# Patient Record
Sex: Female | Born: 1990 | Race: White | Hispanic: No | Marital: Single | Smoking: Former smoker
Health system: Southern US, Community
[De-identification: ages and names within clinical notes are randomized; demographics above are authoritative.]

## PROBLEM LIST (undated history)

## (undated) HISTORY — PX: ANKLE SURGERY: SHX546

---

## 2009-03-22 ENCOUNTER — Ambulatory Visit (HOSPITAL_COMMUNITY): Admission: RE | Admit: 2009-03-22 | Discharge: 2009-03-22 | Payer: Self-pay | Admitting: Obstetrics & Gynecology

## 2009-08-12 ENCOUNTER — Inpatient Hospital Stay (HOSPITAL_COMMUNITY): Admission: AD | Admit: 2009-08-12 | Discharge: 2009-08-14 | Payer: Self-pay | Admitting: Obstetrics and Gynecology

## 2010-12-17 LAB — CBC
Hemoglobin: 7.9 g/dL — ABNORMAL LOW (ref 12.0–15.0)
MCV: 88.9 fL (ref 78.0–100.0)
Platelets: 176 10*3/uL (ref 150–400)
RBC: 2.58 MIL/uL — ABNORMAL LOW (ref 3.87–5.11)
RDW: 14.2 % (ref 11.5–15.5)
WBC: 7.5 10*3/uL (ref 4.0–10.5)

## 2010-12-17 LAB — RPR: RPR Ser Ql: NONREACTIVE

## 2011-11-10 ENCOUNTER — Encounter (HOSPITAL_COMMUNITY): Payer: Self-pay | Admitting: *Deleted

## 2011-11-10 ENCOUNTER — Emergency Department (HOSPITAL_COMMUNITY)
Admission: EM | Admit: 2011-11-10 | Discharge: 2011-11-10 | Disposition: A | Discharge status: Home / Self Care | Payer: Medicaid Other | Attending: Emergency Medicine | Admitting: Emergency Medicine

## 2011-11-10 ENCOUNTER — Emergency Department (HOSPITAL_COMMUNITY): Payer: Medicaid Other

## 2011-11-10 DIAGNOSIS — S82899A Other fracture of unspecified lower leg, initial encounter for closed fracture: Secondary | ICD-10-CM | POA: Insufficient documentation

## 2011-11-10 DIAGNOSIS — S82892A Other fracture of left lower leg, initial encounter for closed fracture: Secondary | ICD-10-CM

## 2011-11-10 DIAGNOSIS — M25579 Pain in unspecified ankle and joints of unspecified foot: Secondary | ICD-10-CM | POA: Insufficient documentation

## 2011-11-10 DIAGNOSIS — R209 Unspecified disturbances of skin sensation: Secondary | ICD-10-CM | POA: Insufficient documentation

## 2011-11-10 DIAGNOSIS — W010XXA Fall on same level from slipping, tripping and stumbling without subsequent striking against object, initial encounter: Secondary | ICD-10-CM | POA: Insufficient documentation

## 2011-11-10 MED ORDER — OXYCODONE-ACETAMINOPHEN 5-325 MG PO TABS: ORAL_TABLET | ORAL | Status: DC

## 2011-11-10 MED ORDER — OXYCODONE-ACETAMINOPHEN 5-325 MG PO TABS
2.0000 | ORAL_TABLET | Freq: Once | ORAL | Status: AC
  Administered 2011-11-10: 2 via ORAL
  Filled 2011-11-10: qty 2

## 2011-11-10 NOTE — ED Notes (Signed)
Pt states "was going down the ramp @ home and I slipped, I heard something pop"; pt presents with immobilizer from EMS to left ankle

## 2011-11-10 NOTE — ED Provider Notes (Signed)
History     CSN: [id number]  Arrival date & time 11/10/11  1407   First MD Initiated Contact with Patient 11/10/11 1632      Chief Complaint  Patient presents with  . Ankle Pain    (Consider location/radiation/quality/duration/timing/severity/associated sxs/prior treatment) HPI Comments: Patient reports that her home that she has a ramp the leads from her house to the ground. She was walking down and it is raining today and the grandma was wet, she is unsure if she simply slipped or her ankle turned but she reports hearing a loud pop and having immediate pain and she fell over and into the grass. She denies any other injuries and reports only pain to her distal left lower extremity. She denies any hip, thigh, knee pain. She reports some very mild tingling to her left foot. She reports that it is cold outside and it does feel a little numb. EMS was contacted and the patient is transported here with splint placed. She denies any other significant past medical history and has never had any significant orthopedic injuries in the past.  The history is provided by the patient and a relative.    History reviewed. No pertinent past medical history.  Past Surgical History  Procedure Date  . Cesarean section     History reviewed. No pertinent family history.  History  Substance Use Topics  . Smoking status: Former Games developer  . Smokeless tobacco: Not on file  . Alcohol Use: Yes     ocassionally    OB History    Grav Para Term Preterm Abortions TAB SAB Ect Mult Living                  Review of Systems  Constitutional: Negative.   HENT: Negative for neck pain.   Respiratory: Negative for shortness of breath.   Cardiovascular: Negative for chest pain.  Musculoskeletal: Positive for joint swelling and arthralgias. Negative for back pain.  Skin: Negative for color change, pallor and wound.  Neurological: Positive for numbness. Negative for syncope, weakness and headaches.     Allergies  Review of patient's allergies indicates no known allergies.  Home Medications   Current Outpatient Rx  Name Route Sig Dispense Refill  . NORGESTIMATE-ETH ESTRADIOL 0.25-35 MG-MCG PO TABS Oral Take 1 tablet by mouth daily.    Marland Kitchen PROMETHAZINE HCL 25 MG PO TABS Oral Take 25 mg by mouth every 6 (six) hours as needed. For nausea and vomiting    . OXYCODONE-ACETAMINOPHEN 5-325 MG PO TABS  1-2 tablets po q 6 hours prn pain 30 tablet 0    BP 124/69  Pulse 88  Temp(Src) 98.2 F (36.8 C) (Oral)  Resp 20  Wt 190 lb (86.183 kg)  SpO2 100%  LMP 10/29/2011  Physical Exam  Nursing note and vitals reviewed. Constitutional: She appears well-developed and well-nourished.  HENT:  Head: Normocephalic and atraumatic.  Neck: Neck supple. No spinous process tenderness present.  Pulmonary/Chest: Effort normal.  Abdominal: Soft. Normal appearance. There is no tenderness.  Musculoskeletal:       Left ankle: She exhibits decreased range of motion, swelling and deformity. She exhibits no laceration and normal pulse. tenderness. Lateral malleolus and medial malleolus tenderness found. No head of 5th metatarsal and no proximal fibula tenderness found. Achilles tendon exhibits normal Thompson's test results.       Tender with swelling noted to B malleoli, not tender at calcaneous.  No abrasions or lacerations.  Compartments are soft  Neurological:  Gross sensation and able to wiggle toes.      ED Course  Procedures (including critical care time)  Labs Reviewed - No data to display Dg Ankle Complete Left  11/10/2011  *RADIOLOGY REPORT*  Clinical Data: Fall.  Ankle injury with pain and swelling.  LEFT ANKLE COMPLETE - 3+ VIEW  Comparison: None.  Findings: Nondisplaced fractures are seen involving the medial and posterior malleoli of the distal tibia.  There is a comminuted fracture of the distal fibular diaphysis above the level of the ankle joint.  The talus remains centered within the  ankle mortise.  IMPRESSION:  1.  Nondisplaced fractures involving the medial and posterior malleoli of the distal tibia. 2.  Comminuted fracture of the distal fibular diaphysis, above the ankle joint.  Original Report Authenticated By: Danae Orleans, M.D.   I reviewed above film myself.    1. Ankle fracture, left       MDM  Pt with distal extremity fracture, neurovascularly intact.  Compartments are soft, not open fracture.  Will splint, crutches, recommend RICE at home.  I spoke to Dr. Lestine Box with [facility] who will see her on Thursday mornign at 8 AM.  Pt and family agree with plan.     5:29 PM  After splint applied, I re-examined foot, no discoloration, neuro vascularly intact.  Fracture stabilized for oupt treatment.          Gavin Pound. Shantel Helwig, MD 11/10/11 1729

## 2011-11-10 NOTE — ED Notes (Signed)
Patient given discharge instructions, information, prescriptions, and diet order. Patient states that they adequately understand discharge information given and to return to ED if symptoms return or worsen.    Pt informed of tylenol in medication and to follow up with orthopedist.

## 2011-11-10 NOTE — ED Notes (Signed)
Per EMS pt slipped on ramp due to wetness, left ankle pain. No obvious deformity, CMS intact. No other injuries noted.

## 2011-11-10 NOTE — Discharge Instructions (Signed)
Ankle Fracture A fracture is a break in the bone. A cast or splint is used to protect and keep your injured bone from moving.  HOME CARE INSTRUCTIONS   Use your crutches as directed.   To lessen the swelling, keep the injured leg elevated while sitting or lying down.   Apply ice to the injury for 15 to 20 minutes, 3 to 4 times per day while awake for 2 days. Put the ice in a plastic bag and place a thin towel between the bag of ice and your cast.   If you have a plaster or fiberglass cast:   Do not try to scratch the skin under the cast using sharp or pointed objects.   Check the skin around the cast every day. You may put lotion on any red or sore areas.   Keep your cast dry and clean.   If you have a plaster splint:   Wear the splint as directed.   You may loosen the elastic around the splint if your toes become numb, tingle, or turn cold or blue.   Do not put pressure on any part of your cast or splint; it may break. Rest your cast only on a pillow the first 24 hours until it is fully hardened.   Your cast or splint can be protected during bathing with a plastic bag. Do not lower the cast or splint into water.   Take medications as directed by your caregiver. Only take over-the-counter or prescription medicines for pain, discomfort, or fever as directed by your caregiver.   Do not drive a vehicle until your caregiver specifically tells you it is safe to do so.   If your caregiver has given you a follow-up appointment, it is very important to keep that appointment. Not keeping the appointment could result in a chronic or permanent injury, pain, and disability. If there is any problem keeping the appointment, you must call back to this facility for assistance.  SEEK IMMEDIATE MEDICAL CARE IF:   Your cast gets damaged or breaks.   You have continued severe pain or more swelling than you did before the cast was put on.   Your skin or toenails below the injury turn blue or gray,  or feel cold or numb.   There is a bad smell or new stains and/or purulent (pus like) drainage coming from under the cast.  If you do not have a window in your cast for observing the wound, a discharge or minor bleeding may show up as a stain on the outside of your cast. Report these findings to your caregiver. MAKE SURE YOU:   Understand these instructions.   Will watch your condition.   Will get help right away if you are not doing well or get worse.  Document Released: 08/28/2000 Document Revised: 05/13/2011 Document Reviewed: 04/03/2008 [facility] Patient Information 2012 [place], [place].   Narcotic and benzodiazepine use may cause drowsiness, slowed breathing or dependence.  Please use with caution and do not drive, operate machinery or watch young children alone while taking them.  Taking combinations of these medications or drinking alcohol will potentiate these effects.  Dr. Lestine Box will see you first thing Thursday morning at 8 AM.  He is to instruct staff to let you in that morning.

## 2012-01-01 ENCOUNTER — Ambulatory Visit: Payer: Medicaid Other | Attending: Orthopedic Surgery | Admitting: Physical Therapy

## 2012-01-01 DIAGNOSIS — IMO0001 Reserved for inherently not codable concepts without codable children: Secondary | ICD-10-CM | POA: Insufficient documentation

## 2012-01-01 DIAGNOSIS — M25579 Pain in unspecified ankle and joints of unspecified foot: Secondary | ICD-10-CM | POA: Insufficient documentation

## 2012-01-01 DIAGNOSIS — M25676 Stiffness of unspecified foot, not elsewhere classified: Secondary | ICD-10-CM | POA: Insufficient documentation

## 2012-01-01 DIAGNOSIS — M25673 Stiffness of unspecified ankle, not elsewhere classified: Secondary | ICD-10-CM | POA: Insufficient documentation

## 2012-01-14 ENCOUNTER — Ambulatory Visit: Payer: Medicaid Other | Admitting: Physical Therapy

## 2012-01-14 ENCOUNTER — Ambulatory Visit: Payer: Medicaid Other | Attending: Orthopedic Surgery | Admitting: Physical Therapy

## 2012-01-14 DIAGNOSIS — IMO0001 Reserved for inherently not codable concepts without codable children: Secondary | ICD-10-CM | POA: Insufficient documentation

## 2012-01-14 DIAGNOSIS — M25673 Stiffness of unspecified ankle, not elsewhere classified: Secondary | ICD-10-CM | POA: Insufficient documentation

## 2012-01-14 DIAGNOSIS — M25676 Stiffness of unspecified foot, not elsewhere classified: Secondary | ICD-10-CM | POA: Insufficient documentation

## 2012-01-14 DIAGNOSIS — M25579 Pain in unspecified ankle and joints of unspecified foot: Secondary | ICD-10-CM | POA: Insufficient documentation

## 2012-02-15 ENCOUNTER — Ambulatory Visit: Payer: Medicaid Other | Attending: Orthopedic Surgery | Admitting: Physical Therapy

## 2012-02-15 DIAGNOSIS — M25673 Stiffness of unspecified ankle, not elsewhere classified: Secondary | ICD-10-CM | POA: Insufficient documentation

## 2012-02-15 DIAGNOSIS — IMO0001 Reserved for inherently not codable concepts without codable children: Secondary | ICD-10-CM | POA: Insufficient documentation

## 2012-02-15 DIAGNOSIS — M25676 Stiffness of unspecified foot, not elsewhere classified: Secondary | ICD-10-CM | POA: Insufficient documentation

## 2012-02-15 DIAGNOSIS — M25579 Pain in unspecified ankle and joints of unspecified foot: Secondary | ICD-10-CM | POA: Insufficient documentation

## 2012-02-26 ENCOUNTER — Ambulatory Visit: Payer: Medicaid Other | Admitting: Physical Therapy

## 2013-01-03 ENCOUNTER — Encounter (HOSPITAL_COMMUNITY): Payer: Self-pay | Admitting: Emergency Medicine

## 2013-01-03 DIAGNOSIS — Z3202 Encounter for pregnancy test, result negative: Secondary | ICD-10-CM | POA: Insufficient documentation

## 2013-01-03 DIAGNOSIS — N949 Unspecified condition associated with female genital organs and menstrual cycle: Secondary | ICD-10-CM | POA: Insufficient documentation

## 2013-01-03 DIAGNOSIS — F17200 Nicotine dependence, unspecified, uncomplicated: Secondary | ICD-10-CM | POA: Insufficient documentation

## 2013-01-03 LAB — CBC WITH DIFFERENTIAL/PLATELET
Basophils Absolute: 0 10*3/uL (ref 0.0–0.1)
Basophils Relative: 0 % (ref 0–1)
Hemoglobin: 13.2 g/dL (ref 12.0–15.0)
Lymphocytes Relative: 38 % (ref 12–46)
MCHC: 34.6 g/dL (ref 30.0–36.0)
Monocytes Relative: 7 % (ref 3–12)
Neutro Abs: 3.6 10*3/uL (ref 1.7–7.7)
Neutrophils Relative %: 52 % (ref 43–77)
WBC: 6.9 10*3/uL (ref 4.0–10.5)

## 2013-01-03 LAB — COMPREHENSIVE METABOLIC PANEL
AST: 21 U/L (ref 0–37)
Albumin: 3.7 g/dL (ref 3.5–5.2)
Alkaline Phosphatase: 74 U/L (ref 39–117)
BUN: 13 mg/dL (ref 6–23)
Chloride: 103 mEq/L (ref 96–112)
Potassium: 3.7 mEq/L (ref 3.5–5.1)
Total Bilirubin: 0.1 mg/dL — ABNORMAL LOW (ref 0.3–1.2)

## 2013-01-03 LAB — URINALYSIS, ROUTINE W REFLEX MICROSCOPIC
Ketones, ur: NEGATIVE mg/dL
Leukocytes, UA: NEGATIVE
Nitrite: NEGATIVE
Specific Gravity, Urine: 1.034 — ABNORMAL HIGH (ref 1.005–1.030)
pH: 6 (ref 5.0–8.0)

## 2013-01-03 NOTE — ED Notes (Signed)
PT. REPORTS LOW ABDOMINAL PAIN WITH EMESIS FOR 2 WEEKS WORSE THIS PAST SEVERAL DAYS , SLIGHT NAUSEA , NO VOMITTING OR DIARRHEA.

## 2013-01-04 ENCOUNTER — Emergency Department (HOSPITAL_COMMUNITY)
Admission: EM | Admit: 2013-01-04 | Discharge: 2013-01-04 | Disposition: A | Discharge status: Home / Self Care | Payer: Medicaid Other | Attending: Emergency Medicine | Admitting: Emergency Medicine

## 2013-01-04 ENCOUNTER — Emergency Department (HOSPITAL_COMMUNITY): Payer: Medicaid Other

## 2013-01-04 ENCOUNTER — Encounter (HOSPITAL_COMMUNITY): Payer: Self-pay | Admitting: *Deleted

## 2013-01-04 DIAGNOSIS — R102 Pelvic and perineal pain: Secondary | ICD-10-CM

## 2013-01-04 LAB — WET PREP, GENITAL

## 2013-01-04 MED ORDER — KETOROLAC TROMETHAMINE 60 MG/2ML IM SOLN
60.0000 mg | Freq: Once | INTRAMUSCULAR | Status: AC
  Administered 2013-01-04: 60 mg via INTRAMUSCULAR
  Filled 2013-01-04: qty 2

## 2013-01-04 MED ORDER — IBUPROFEN 800 MG PO TABS: 800.0000 mg | ORAL_TABLET | Freq: Three times a day (TID) | ORAL | Status: DC

## 2013-01-04 MED ORDER — ONDANSETRON HCL 4 MG PO TABS: 4.0000 mg | ORAL_TABLET | Freq: Four times a day (QID) | ORAL | Status: DC

## 2013-01-04 NOTE — ED Provider Notes (Signed)
History     CSN: [id number]  Arrival date & time 01/03/13  2237   First MD Initiated Contact with Patient 01/04/13 0302      Chief Complaint  Patient presents with  . Abdominal Pain    (Consider location/radiation/quality/duration/timing/severity/associated sxs/prior treatment) HPI Hx per PT - L sided pelvic pain x 2 weeks MOD in severity, sharp in quality constant pain, no back pain, no N/V/D. No vag bleeding or discharge, LMP 12-01-12. No h/o same, no dysuria. No known aggrevating or alleviating factors History reviewed. No pertinent past medical history.  Past Surgical History  Procedure Laterality Date  . Cesarean section    . Ankle surgery      No family history on file.  History  Substance Use Topics  . Smoking status: Current Every Day Smoker  . Smokeless tobacco: Not on file  . Alcohol Use: Yes    OB History   Grav Para Term Preterm Abortions TAB SAB Ect Mult Living                  Review of Systems  Constitutional: Negative for fever and chills.  HENT: Negative for neck pain and neck stiffness.   Eyes: Negative for pain.  Respiratory: Negative for shortness of breath.   Cardiovascular: Negative for chest pain.  Gastrointestinal: Negative for abdominal pain.  Genitourinary: Positive for pelvic pain. Negative for dysuria and flank pain.  Musculoskeletal: Negative for back pain.  Skin: Negative for rash.  Neurological: Negative for headaches.  All other systems reviewed and are negative.    Allergies  Review of patient's allergies indicates no known allergies.  Home Medications  No current outpatient prescriptions on file.  BP 126/86  Pulse 90  Temp(Src) 98.5 F (36.9 C) (Oral)  Resp 14  SpO2 99%  LMP 12/01/2012  Physical Exam  Constitutional: She is oriented to person, place, and time. She appears well-developed and well-nourished.  HENT:  Head: Normocephalic and atraumatic.  Eyes: EOM are normal. Pupils are equal, round, and reactive to  light.  Neck: Neck supple.  Cardiovascular: Normal rate, regular rhythm and intact distal pulses.   Pulmonary/Chest: Effort normal and breath sounds normal. No respiratory distress.  Abdominal: Soft. Bowel sounds are normal. She exhibits no distension. There is no guarding.  No CVAT, TTP LLQ  Genitourinary: Vagina normal.  No adnexal tenderness, no CMT, minimal white vag d/c  Musculoskeletal: Normal range of motion. She exhibits no edema.  Neurological: She is alert and oriented to person, place, and time.  Skin: Skin is warm and dry.    ED Course  Procedures (including critical care time)  Results for orders placed during the hospital encounter of 01/04/13  WET PREP, GENITAL      Result Value Range   Yeast Wet Prep HPF POC NONE SEEN  NONE SEEN   Trich, Wet Prep NONE SEEN  NONE SEEN   Clue Cells Wet Prep HPF POC FEW (*) NONE SEEN   WBC, Wet Prep HPF POC FEW (*) NONE SEEN  URINALYSIS, ROUTINE W REFLEX MICROSCOPIC      Result Value Range   Color, Urine YELLOW  YELLOW   APPearance CLOUDY (*) CLEAR   Specific Gravity, Urine 1.034 (*) 1.005 - 1.030   pH 6.0  5.0 - 8.0   Glucose, UA NEGATIVE  NEGATIVE mg/dL   Hgb urine dipstick NEGATIVE  NEGATIVE   Bilirubin Urine NEGATIVE  NEGATIVE   Ketones, ur NEGATIVE  NEGATIVE mg/dL   Protein, ur NEGATIVE  NEGATIVE mg/dL   Urobilinogen, UA 1.0  0.0 - 1.0 mg/dL   Nitrite NEGATIVE  NEGATIVE   Leukocytes, UA NEGATIVE  NEGATIVE  CBC WITH DIFFERENTIAL      Result Value Range   WBC 6.9  4.0 - 10.5 K/uL   RBC 4.59  3.87 - 5.11 MIL/uL   Hemoglobin 13.2  12.0 - 15.0 g/dL   HCT 16.1  09.6 - 04.5 %   MCV 83.2  78.0 - 100.0 fL   MCH 28.8  26.0 - 34.0 pg   MCHC 34.6  30.0 - 36.0 g/dL   RDW 40.9  81.1 - 91.4 %   Platelets 179  150 - 400 K/uL   Neutrophils Relative 52  43 - 77 %   Neutro Abs 3.6  1.7 - 7.7 K/uL   Lymphocytes Relative 38  12 - 46 %   Lymphs Abs 2.6  0.7 - 4.0 K/uL   Monocytes Relative 7  3 - 12 %   Monocytes Absolute 0.5  0.1 -  1.0 K/uL   Eosinophils Relative 2  0 - 5 %   Eosinophils Absolute 0.1  0.0 - 0.7 K/uL   Basophils Relative 0  0 - 1 %   Basophils Absolute 0.0  0.0 - 0.1 K/uL  COMPREHENSIVE METABOLIC PANEL      Result Value Range   Sodium 138  135 - 145 mEq/L   Potassium 3.7  3.5 - 5.1 mEq/L   Chloride 103  96 - 112 mEq/L   CO2 25  19 - 32 mEq/L   Glucose, Bld 96  70 - 99 mg/dL   BUN 13  6 - 23 mg/dL   Creatinine, Ser 7.82  0.50 - 1.10 mg/dL   Calcium 9.4  8.4 - 95.6 mg/dL   Total Protein 7.6  6.0 - 8.3 g/dL   Albumin 3.7  3.5 - 5.2 g/dL   AST 21  0 - 37 U/L   ALT 22  0 - 35 U/L   Alkaline Phosphatase 74  39 - 117 U/L   Total Bilirubin 0.1 (*) 0.3 - 1.2 mg/dL   GFR calc non Af Amer >90  >90 mL/min   GFR calc Af Amer >90  >90 mL/min  POCT PREGNANCY, URINE      Result Value Range   Preg Test, Ur NEGATIVE  NEGATIVE   US Transvaginal Non-ob  01/04/2013  *RADIOLOGY REPORT*  Clinical Data: Left pelvic pain  TRANSABDOMINAL AND TRANSVAGINAL ULTRASOUND OF PELVIS Technique:  Both transabdominal and transvaginal ultrasound examinations of the pelvis were performed. Transabdominal technique was performed for global imaging of the pelvis including uterus, ovaries, adnexal regions, and pelvic cul-de-sac.  It was necessary to proceed with endovaginal exam following the transabdominal exam to visualize the endometrium and adnexa.  Comparison:  None  Findings:  Uterus: Normal in size and appearance, measuring 8.3 x 4.9 x 5.8 cm.  Endometrium: Normal in thickness and appearance for point in cycle, measuring 12 mm.  Right ovary:  Normal appearance/no adnexal mass.  Ovary measures 3.5 x 2.2 x 3.1 cm.  Left ovary: Normal appearance/no adnexal mass.  Ovary measures 2.5 x 1.7 x 1.8 cm.  Other findings: Small amount of free fluid is likely physiologic. Nabothian cysts noted.  IMPRESSION: Normal study. No evidence of pelvic mass or other significant abnormality.   Original Report Authenticated By: Jearld Lesch, M.D.    US  Pelvis Complete  01/04/2013  *RADIOLOGY REPORT*  Clinical Data: Left pelvic pain  TRANSABDOMINAL  AND TRANSVAGINAL ULTRASOUND OF PELVIS Technique:  Both transabdominal and transvaginal ultrasound examinations of the pelvis were performed. Transabdominal technique was performed for global imaging of the pelvis including uterus, ovaries, adnexal regions, and pelvic cul-de-sac.  It was necessary to proceed with endovaginal exam following the transabdominal exam to visualize the endometrium and adnexa.  Comparison:  None  Findings:  Uterus: Normal in size and appearance, measuring 8.3 x 4.9 x 5.8 cm.  Endometrium: Normal in thickness and appearance for point in cycle, measuring 12 mm.  Right ovary:  Normal appearance/no adnexal mass.  Ovary measures 3.5 x 2.2 x 3.1 cm.  Left ovary: Normal appearance/no adnexal mass.  Ovary measures 2.5 x 1.7 x 1.8 cm.  Other findings: Small amount of free fluid is likely physiologic. Nabothian cysts noted.  IMPRESSION: Normal study. No evidence of pelvic mass or other significant abnormality.   Original Report Authenticated By: Jearld Lesch, M.D.    IM toradol   MDM  L sided pelvic pain x 2 weeks. GC/ Chl Cx pending  [place]. Labs. UA/ UPT  Improved with medications  VS and nursing notes reviewed  Plan outpatient follow up - GYN referral        Sunnie Nielsen, MD 01/04/13 0630

## 2013-01-04 NOTE — ED Notes (Signed)
The pt returned from [place] waiting for the results

## 2013-01-04 NOTE — ED Notes (Signed)
Pt going to u/s 

## 2013-01-25 ENCOUNTER — Ambulatory Visit (INDEPENDENT_AMBULATORY_CARE_PROVIDER_SITE_OTHER): Payer: Medicaid Other | Admitting: Obstetrics & Gynecology

## 2013-01-25 ENCOUNTER — Encounter: Payer: Self-pay | Admitting: Obstetrics & Gynecology

## 2013-01-25 VITALS — BP 122/79 | HR 86 | Temp 97.8°F | Ht 63.0 in | Wt 187.3 lb

## 2013-01-25 DIAGNOSIS — R109 Unspecified abdominal pain: Secondary | ICD-10-CM

## 2013-01-25 NOTE — Patient Instructions (Addendum)
Pelvic Pain Pelvic pain is pain below the belly button and located between your hips. Acute pain may last a few hours or days. Chronic pelvic pain may last weeks and months. The cause may be different for different types of pain. The pain may be dull or sharp, mild or severe and can interfere with your daily activities. Write down and tell your caregiver:   Exactly where the pain is located.  If it comes and goes or is there all the time.  When it happens (with sex, urination, bowel movement, etc.)  If the pain is related to your menstrual period or stress. Your caregiver will take a full history and do a complete physical exam and Pap test. CAUSES   Painful menstrual periods (dysmenorrhea).  Normal ovulation (Mittelschmertz) that occurs in the middle of the menstrual cycle every month.  The pelvic organs get engorged with blood just before the menstrual period (pelvic congestive syndrome).  Scar tissue from an infection or past surgery (pelvic adhesions).  Cancer of the female pelvic organs. When there is pain with cancer, it has been there for a long time.  The lining of the uterus (endometrium) abnormally grows in places like the pelvis and on the pelvic organs (endometriosis).  A form of endometriosis with the lining of the uterus present inside of the muscle tissue of the uterus (adenomyosis).  Fibroid tumor (noncancerous) in the uterus.  Bladder problems such as infection, bladder spasms of the muscle tissue of the bladder.  Intestinal problems (irritable bowel syndrome, colitis, an ulcer or gastrointestinal infection).  Polyps of the cervix or uterus.  Pregnancy in the tube (ectopic pregnancy).  The opening of the cervix is too small for the menstrual blood to flow through it (cervical stenosis).  Physical or sexual abuse (past or present).  Musculo-skeletal problems from poor posture, problems with the vertebrae of the lower back or the uterine pelvic muscles falling  (prolapse).  Psychological problems such as depression or stress.  IUD (intrauterine device) in the uterus. DIAGNOSIS  Tests to make a diagnosis depends on the type, location, severity and what causes the pain to occur. Tests that may be needed include:  Blood tests.  Urine tests  Ultrasound.  X-rays.  CT Scan.  MRI.  Laparoscopy.  Major surgery. TREATMENT  Treatment will depend on the cause of the pain, which includes:  Prescription or over-the-counter pain medication.  Antibiotics.  Birth control pills.  Hormone treatment.  Nerve blocking injections.  Physical therapy.  Antidepressants.  Counseling with a psychiatrist or psychologist.  Minor or major surgery. HOME CARE INSTRUCTIONS   Only take over-the-counter or prescription medicines for pain, discomfort or fever as directed by your caregiver.  Follow your caregiver's advice to treat your pain.  Rest.  Avoid sexual intercourse if it causes the pain.  Apply warm or cold compresses (which ever works best) to the pain area.  Do relaxation exercises such as yoga or meditation.  Try acupuncture.  Avoid stressful situations.  Try group therapy.  If the pain is because of a stomach/intestinal upset, drink clear liquids, eat a bland light food diet until the symptoms go away. SEEK MEDICAL CARE IF:   You need stronger prescription pain medication.  You develop pain with sexual intercourse.  You have pain with urination.  You develop a temperature of 102 F (38.9 C) with the pain.  You are still in pain after 4 hours of taking prescription medication for the pain.  You need depression medication.    Your IUD is causing pain and you want it removed. SEEK IMMEDIATE MEDICAL CARE IF:  You develop very severe pain or tenderness.  You faint, have chills, severe weakness or dehydration.  You develop heavy vaginal bleeding or passing solid tissue.  You develop a temperature of 102 F (38.9 C)  with the pain.  You have blood in the urine.  You are being physically or sexually abused.  You have uncontrolled vomiting and diarrhea.  You are depressed and afraid of harming yourself or someone else. Document Released: 10/08/2004 Document Revised: 11/23/2011 Document Reviewed: 07/05/2008 [employer] Patient Information 2013 [employer].  

## 2013-01-25 NOTE — Progress Notes (Signed)
Patient ID: Anita Mann, female   DOB: May 30, 1991, 22 y.o.   MRN: [id number]  Chief Complaint  Patient presents with  . Follow-up    from ER    HPI Anita Mann is a 22 y.o. female.  G1P1001 Patient's last menstrual period was 01/06/2013. Seen in ED 4/23 with lower and upper abdominal pain for 2 weeks, now almost completely resolved. Had mild nausea, no diarrhea   HPI  History reviewed. No pertinent past medical history.  Past Surgical History  Procedure Laterality Date  . Cesarean section    . Ankle surgery      History reviewed. No pertinent family history.  Social History History  Substance Use Topics  . Smoking status: Current Every Day Smoker  . Smokeless tobacco: Not on file  . Alcohol Use: Yes     Comment: ocassionally    Allergies  Allergen Reactions  . Cherry Swelling    Throat swells    Current Outpatient Prescriptions  Medication Sig Dispense Refill  . ibuprofen (ADVIL,MOTRIN) 800 MG tablet Take 1 tablet (800 mg total) by mouth 3 (three) times daily.  21 tablet  0  . norgestimate-ethinyl estradiol (ORTHO-CYCLEN,SPRINTEC,PREVIFEM) 0.25-35 MG-MCG tablet Take 1 tablet by mouth daily.      . ondansetron (ZOFRAN) 4 MG tablet Take 1 tablet (4 mg total) by mouth every 6 (six) hours.  12 tablet  0  . oxyCODONE-acetaminophen (PERCOCET) 5-325 MG per tablet 1-2 tablets po q 6 hours prn pain  30 tablet  0  . promethazine (PHENERGAN) 25 MG tablet Take 25 mg by mouth every 6 (six) hours as needed. For nausea and vomiting       No current facility-administered medications for this visit.    Review of Systems Review of Systems  Constitutional: Negative for fever and unexpected weight change.  Gastrointestinal: Negative for nausea, vomiting and abdominal distention.  Genitourinary: Negative for vaginal bleeding, vaginal discharge and pelvic pain.    Blood pressure 122/79, pulse 86, temperature 97.8 F (36.6 C), temperature source Oral, height 5\' 3"  (1.6 m),  weight 187 lb 4.8 oz (84.959 kg), last menstrual period 01/06/2013.  Physical Exam Physical Exam  Constitutional: She appears well-developed and well-nourished. No distress.  Pulmonary/Chest: Effort normal. No respiratory distress.  Abdominal: Soft. She exhibits no distension and no mass. There is no tenderness.  Genitourinary: Vagina normal and uterus normal. No vaginal discharge found.  No mass. STD probe done  Skin: Skin is warm and dry.  Psychiatric: She has a normal mood and affect. Her behavior is normal.    Data Reviewed Urinalysis    Component Value Date/Time   COLORURINE YELLOW 01/03/2013 2319   APPEARANCEUR CLOUDY* 01/03/2013 2319   LABSPEC 1.034* 01/03/2013 2319   PHURINE 6.0 01/03/2013 2319   GLUCOSEU NEGATIVE 01/03/2013 2319   HGBUR NEGATIVE 01/03/2013 2319   BILIRUBINUR NEGATIVE 01/03/2013 2319   KETONESUR NEGATIVE 01/03/2013 2319   PROTEINUR NEGATIVE 01/03/2013 2319   UROBILINOGEN 1.0 01/03/2013 2319   NITRITE NEGATIVE 01/03/2013 2319   LEUKOCYTESUR NEGATIVE 01/03/2013 2319     CBC    Component Value Date/Time   WBC 6.9 01/03/2013 2249   RBC 4.59 01/03/2013 2249   HGB 13.2 01/03/2013 2249   HCT 38.2 01/03/2013 2249   PLT 179 01/03/2013 2249   MCV 83.2 01/03/2013 2249   MCH 28.8 01/03/2013 2249   MCHC 34.6 01/03/2013 2249   RDW 13.0 01/03/2013 2249   LYMPHSABS 2.6 01/03/2013 2249   MONOABS 0.5 01/03/2013 2249  EOSABS 0.1 01/03/2013 2249   BASOSABS 0.0 01/03/2013 2249    Normal pelvic US  Assessment    S/P abdominal pelvic pain resolving, benign exam     Plan    F/U on lab, report if sx return        ARNOLD,JAMES 01/25/2013, 4:59 PM

## 2014-07-16 ENCOUNTER — Encounter: Payer: Self-pay | Admitting: Obstetrics & Gynecology

## 2016-04-02 ENCOUNTER — Encounter (HOSPITAL_COMMUNITY): Payer: Self-pay | Admitting: *Deleted

## 2016-04-02 ENCOUNTER — Ambulatory Visit (INDEPENDENT_AMBULATORY_CARE_PROVIDER_SITE_OTHER): Payer: 59

## 2016-04-02 ENCOUNTER — Ambulatory Visit (HOSPITAL_COMMUNITY)
Admission: EM | Admit: 2016-04-02 | Discharge: 2016-04-02 | Disposition: A | Discharge status: Home / Self Care | Payer: 59 | Attending: Emergency Medicine | Admitting: Emergency Medicine

## 2016-04-02 DIAGNOSIS — S96912A Strain of unspecified muscle and tendon at ankle and foot level, left foot, initial encounter: Secondary | ICD-10-CM

## 2016-04-02 MED ORDER — DICLOFENAC SODIUM 75 MG PO TBEC: 75.0000 mg | DELAYED_RELEASE_TABLET | Freq: Two times a day (BID) | ORAL | Status: DC

## 2016-04-02 MED ORDER — TRAMADOL HCL 50 MG PO TABS
50.0000 mg | ORAL_TABLET | Freq: Every evening | ORAL | Status: DC | PRN
Start: 2016-04-02 — End: 2023-05-15

## 2016-04-02 NOTE — ED Provider Notes (Signed)
CSN: [id number]     Arrival date & time 04/02/16  1306 History   First MD Initiated Contact with Patient 04/02/16 1431     Chief Complaint  Patient presents with  . Ankle Pain   (Consider location/radiation/quality/duration/timing/severity/associated sxs/prior Treatment) HPI Comments: Patient presents with left ankle pain and swelling over the past 2 to 3 weeks. Has been playing softball recently and has noticed increased pain with movement. Has history of fracture of left ankle in 2013. Has metal plate in place in ankle. Has tried Tylenol, Ibuprofen, Aleve, ice with no relief. Does have an ankle brace that she usually wears with sports.   Patient is a 25 y.o. female presenting with ankle pain. The history is provided by the patient.  Ankle Pain Location:  Ankle Ankle location:  L ankle Pain details:    Quality:  Shooting   Radiates to:  L leg   Severity:  Moderate   Onset quality:  Gradual   Duration:  3 weeks   Timing:  Constant   Progression:  Worsening Dislocation: no   Prior injury to area:  Yes Relieved by:  Nothing   History reviewed. No pertinent past medical history. Past Surgical History  Procedure Laterality Date  . Cesarean section    . Ankle surgery     History reviewed. No pertinent family history. Social History  Substance Use Topics  . Smoking status: Current Every Day Smoker  . Smokeless tobacco: None  . Alcohol Use: Yes     Comment: ocassionally   OB History    Gravida Para Term Preterm AB TAB SAB Ectopic Multiple Living   0 0 0 0 0 0 1     Review of Systems  Musculoskeletal: Positive for joint swelling and arthralgias.    Allergies  Cherry  Home Medications   Prior to Admission medications   Medication Sig Start Date End Date Taking? Authorizing Provider  diclofenac (VOLTAREN) 75 MG EC tablet Take 1 tablet (75 mg total) by mouth 2 (two) times daily. 04/02/16   Sudie Grumbling, NP  norgestimate-ethinyl estradiol  (ORTHO-CYCLEN,SPRINTEC,PREVIFEM) 0.25-35 MG-MCG tablet Take 1 tablet by mouth daily.    Historical Provider, MD  traMADol (ULTRAM) 50 MG tablet Take 1 tablet (50 mg total) by mouth at bedtime as needed for moderate pain. 04/02/16   Sudie Grumbling, NP   Meds Ordered and Administered this Visit  Medications - No data to display  BP 134/78 mmHg  Pulse 81  Temp(Src) 98.1 F (36.7 C) (Oral)  Resp 12  SpO2 100%  LMP 03/07/2016 No data found.   Physical Exam  Constitutional: She is oriented to person, place, and time. She appears well-developed and well-nourished. No distress.  Musculoskeletal:       Left ankle: She exhibits decreased range of motion and swelling. She exhibits no ecchymosis and normal pulse. Tenderness. Lateral malleolus and medial malleolus tenderness found. Achilles tendon normal.       Feet:  Left ankle slightly swollen and tender over both medial and lateral malleolus. Slight decrease range of motion- particularly with internal rotation. Dorsal pedal pulses are normal. Good capillary refill.   Neurological: She is alert and oriented to person, place, and time. She has normal strength. No sensory deficit.  Skin: Skin is warm and dry.    ED Course  Procedures (including critical care time)  Labs Review Labs Reviewed - No data to display  Imaging Review Dg Ankle Complete Left  04/02/2016  CLINICAL DATA:  Previous ORIF.  Medial and posterior pain. EXAM: LEFT ANKLE COMPLETE - 3+ VIEW COMPARISON:  11/10/2011 FINDINGS: Previous plate and screw fixation of a distal fibular diaphyseal fracture. Solid union at the fracture site. Previous ORIF of medial malleolar fracture with cortical screw and 10. Solid union in that region. No definite complicating feature. Post traumatic well corticated ossicles in the region the medial ligamentous complex. Posterior tibial lip fracture has healed. Tibiotalar articulation appears intact. Subtalar joint appears normal. IMPRESSION:  Satisfactory appearance following ORIF of distal fibular fracture and medial malleolar fracture. Previously seen posterior lip tibial fracture has healed as well. No post traumatic degenerative changes. Electronically Signed   By: Paulina FusiMark  Shogry M.D.   On: 04/02/2016 15:26     Visual Acuity Review  Right Eye Distance:   Left Eye Distance:   Bilateral Distance:    Right Eye Near:   Left Eye Near:    Bilateral Near:         MDM   1. Left ankle strain, initial encounter    Reviewed x-ray results with patient. No new changes detected. May be soft tissue injury. Recommend rest ankle- wear brace continuously for next 5 to 7 days. Elevate foot as much as possible. Start Diclofenac twice a day as directed. If pain is severe at night, may take Tramadol 50mg  as needed. Recommend follow-up with her Orthopedic if pain and symptoms do not improve in the next 5 to 7 days.     Sudie GrumblingAnn Berry Caidynce Muzyka, NP 04/03/16 2004

## 2016-04-02 NOTE — ED Notes (Signed)
Pt    Reports  She  Has  An  Old  Injury    To  Her  l  Ankle         Years  Ago        She  states over  The  Last  3  Weeks  She  Has  Had  Increase  In pain    And  Some  Swelling  As   Well     she  denys  Any  specefic     Injury    Except     When  She  Bends     To  Play      Catcher

## 2016-04-02 NOTE — Discharge Instructions (Signed)
°  Recommend diclofenac twice a day as directed. Use Tramadol only at night as needed for pain. Use ankle brace for support. Limit activity (softball) for the next week. Follow-up with your Orthopedic if symptoms persist.   Muscle Strain A muscle strain is an injury that occurs when a muscle is stretched beyond its normal length. Usually a small number of muscle fibers are torn when this happens. Muscle strain is rated in degrees. First-degree strains have the least amount of muscle fiber tearing and pain. Second-degree and third-degree strains have increasingly more tearing and pain.  Usually, recovery from muscle strain takes 1-2 weeks. Complete healing takes 5-6 weeks.  CAUSES  Muscle strain happens when a sudden, violent force placed on a muscle stretches it too far. This may occur with lifting, sports, or a fall.  RISK FACTORS Muscle strain is especially common in athletes.  SIGNS AND SYMPTOMS At the site of the muscle strain, there may be:  Pain.  Bruising.  Swelling.  Difficulty using the muscle due to pain or lack of normal function. DIAGNOSIS  Your health care provider will perform a physical exam and ask about your medical history. TREATMENT  Often, the best treatment for a muscle strain is resting, icing, and applying cold compresses to the injured area.  HOME CARE INSTRUCTIONS   Use the PRICE method of treatment to promote muscle healing during the first 2-3 days after your injury. The PRICE method involves:  Protecting the muscle from being injured again.  Restricting your activity and resting the injured body part.  Icing your injury. To do this, put ice in a plastic bag. Place a towel between your skin and the bag. Then, apply the ice and leave it on from 15-20 minutes each hour. After the third day, switch to moist heat packs.  Apply compression to the injured area with a splint or elastic bandage. Be careful not to wrap it too tightly. This may interfere with blood  circulation or increase swelling.  Elevate the injured body part above the level of your heart as often as you can.  Only take over-the-counter or prescription medicines for pain, discomfort, or fever as directed by your health care provider.  Warming up prior to exercise helps to prevent future muscle strains. SEEK MEDICAL CARE IF:   You have increasing pain or swelling in the injured area.  You have numbness, tingling, or a significant loss of strength in the injured area. MAKE SURE YOU:   Understand these instructions.  Will watch your condition.  Will get help right away if you are not doing well or get worse.   This information is not intended to replace advice given to you by your health care provider. Make sure you discuss any questions you have with your health care provider.   Document Released: 08/31/2005 Document Revised: 06/21/2013 Document Reviewed: 03/30/2013 Elsevier Interactive Patient Education [employer]2016 [employer].

## 2017-11-15 IMAGING — DX DG ANKLE COMPLETE 3+V*L*
4 series · 4 of 4 positions shown · non-contrast
Comparison: 11/10/2011

CLINICAL DATA: Previous ORIF.  Medial and posterior pain.

EXAM:
LEFT ANKLE COMPLETE - 3+ VIEW

[ankle ap (1 of 2)]
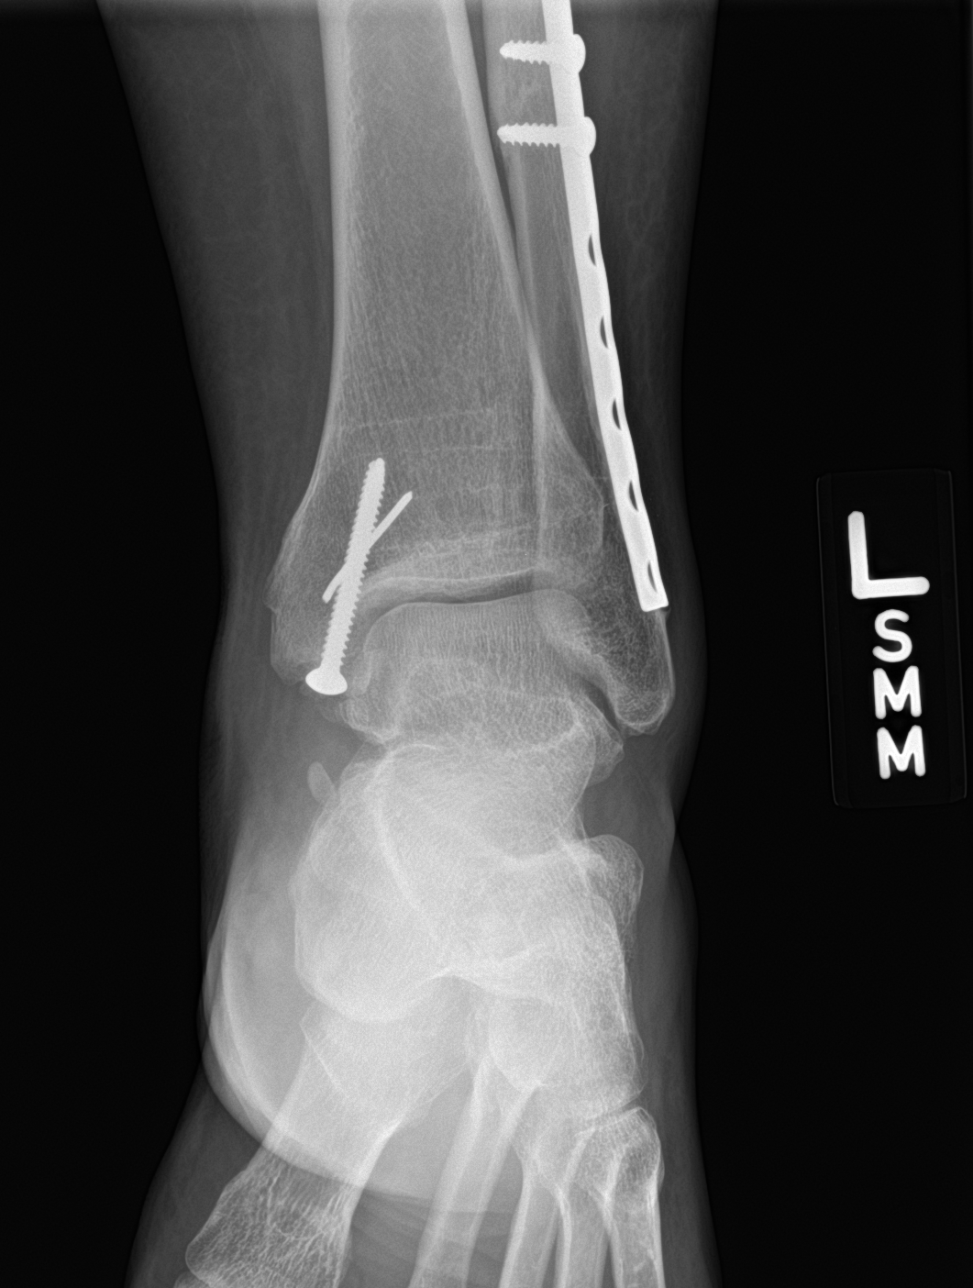

[ankle obl]
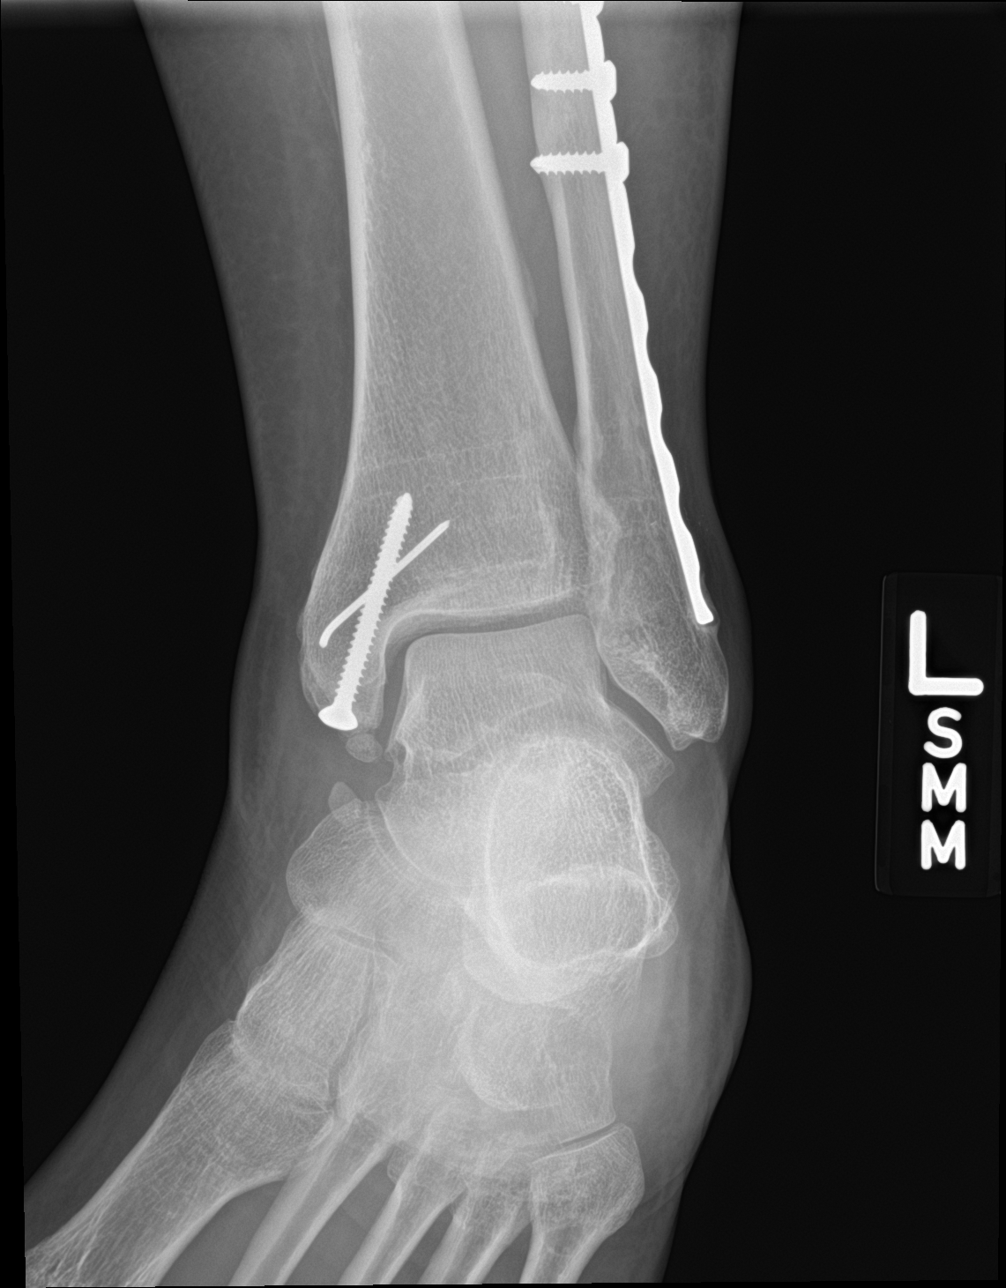

[ankle lat]
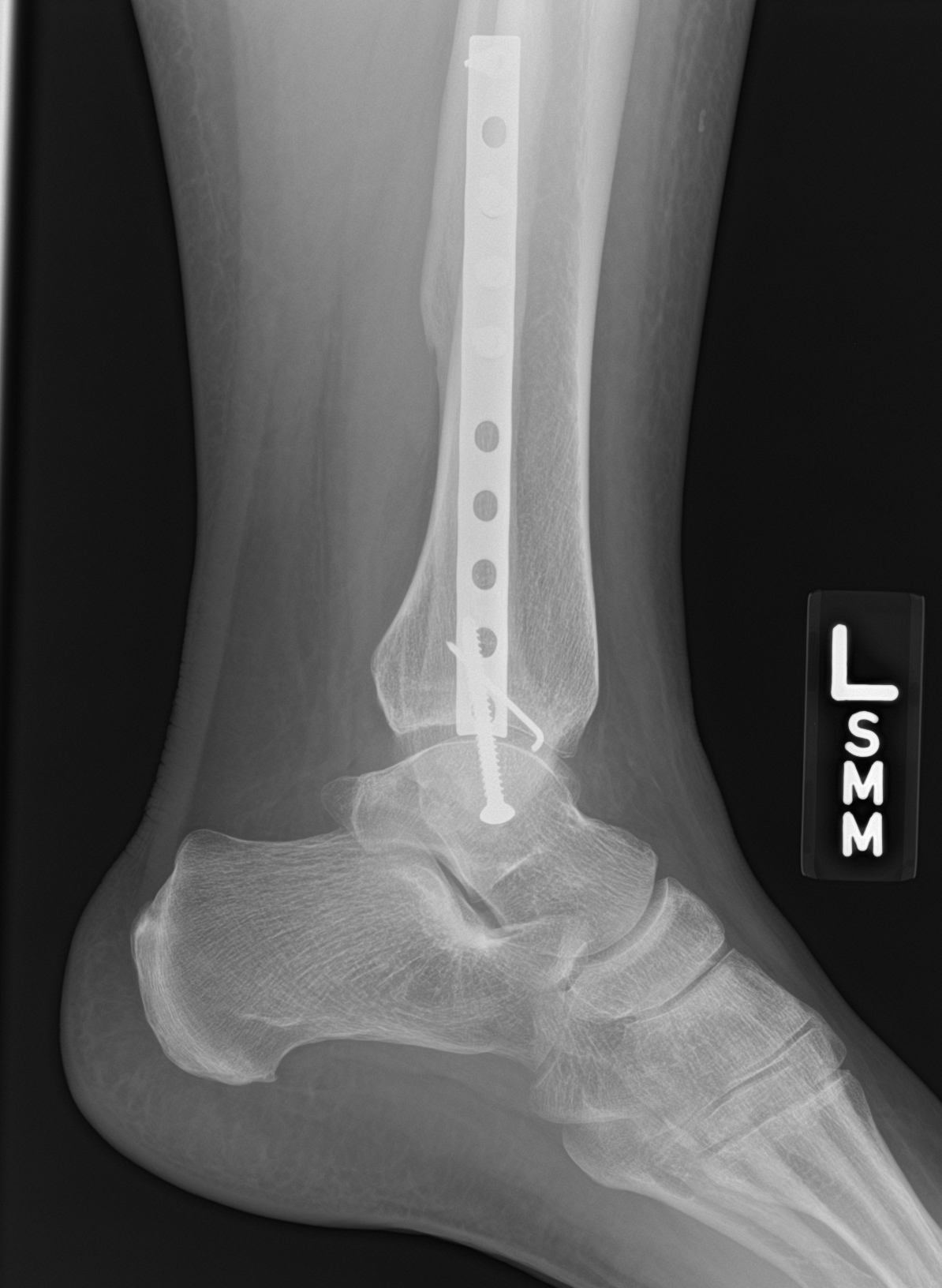

[ankle ap (2 of 2)]
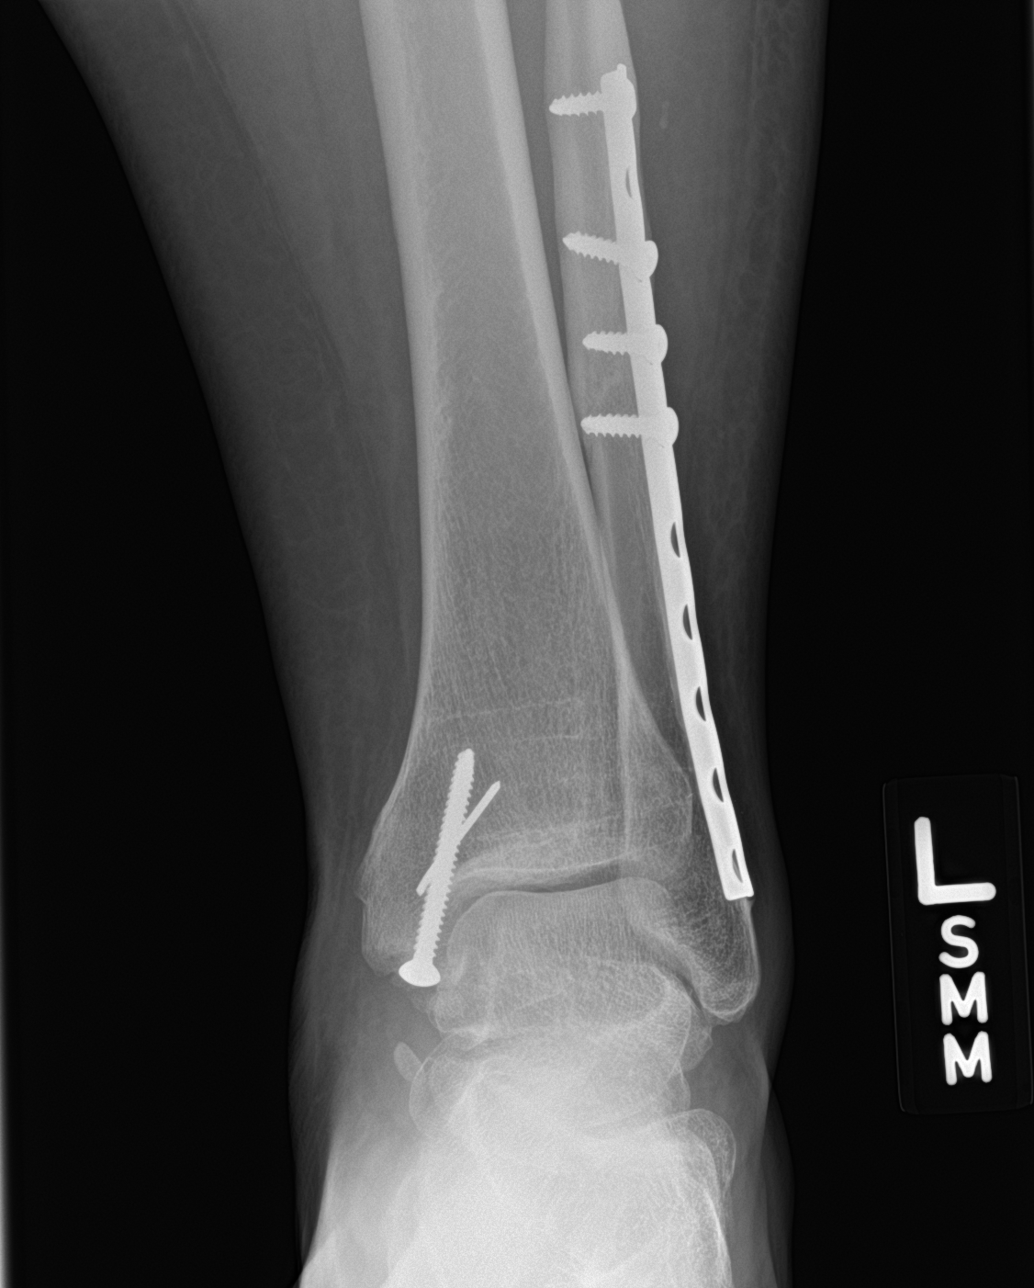

[4 of 4 positions shown; findings below may reference images not displayed]

FINDINGS: Previous plate and screw fixation of a distal fibular diaphyseal
fracture. Solid union at the fracture site. Previous ORIF of medial
malleolar fracture with cortical screw and 10. Solid union in that
region. No definite complicating feature. Post traumatic well
corticated ossicles in the region the medial ligamentous complex.
Posterior tibial lip fracture has healed. Tibiotalar articulation
appears intact. Subtalar joint appears normal.
IMPRESSION: Satisfactory appearance following ORIF of distal fibular fracture
and medial malleolar fracture. Previously seen posterior lip tibial
fracture has healed as well. No post traumatic degenerative changes.

## 2022-10-20 ENCOUNTER — Emergency Department (HOSPITAL_COMMUNITY)
Admission: EM | Admit: 2022-10-20 | Discharge: 2022-10-20 | Disposition: A | Discharge status: Home / Self Care | Payer: 59 | Attending: Emergency Medicine | Admitting: Emergency Medicine

## 2022-10-20 ENCOUNTER — Encounter (HOSPITAL_COMMUNITY): Payer: Self-pay | Admitting: Emergency Medicine

## 2022-10-20 ENCOUNTER — Other Ambulatory Visit: Payer: Self-pay

## 2022-10-20 DIAGNOSIS — U071 COVID-19: Secondary | ICD-10-CM | POA: Insufficient documentation

## 2022-10-20 MED ORDER — PAXLOVID (300/100) 20 X 150 MG & 10 X 100MG PO TBPK: 3.0000 | ORAL_TABLET | Freq: Two times a day (BID) | ORAL | 0 refills | Status: AC

## 2022-10-20 NOTE — Discharge Instructions (Addendum)
You were seen in the ER today for evaluation of your cough and cold symptoms.  For being positive with COVID at home, we can give you some Paxlovid which is an antiviral medication.  I included more from patient about Paxlovid into the discharge paperwork for you to read and follow-up with.  Please take as prescribed.  You can also try over-the-counter cough and cold medication as well.  I would also try some over-the-counter Flonase as well.  You can add on a daily Zyrtec or Claritin to this additionally.  Please make sure you are quarantining to prevent the spread of COVID-19.  If you have any concerns, new or worsening symptoms, please return to the nearest emergency room for evaluation.  Get help right away if: You have trouble breathing. You have pain or pressure in your chest. You are confused. You have bluish lips and fingernails. You have trouble waking from sleep. You have symptoms that get worse. These symptoms may be an emergency. Get help right away. Call 911. Do not wait to see if the symptoms will go away. Do not drive yourself to the hospital.

## 2022-10-20 NOTE — ED Triage Notes (Signed)
Headache, ear  pain, head congestion x 1 day. Tested positive for COVID at home.

## 2022-10-20 NOTE — ED Provider Notes (Signed)
  [facility] Provider Note   CSN: [id number] Arrival date & time: 10/20/22  2774     History {Add pertinent medical, surgical, social history, OB history to HPI:1} Chief Complaint  Patient presents with   Generalized Body Aches   Headache    Anita Mann is a 32 y.o. female.   Headache      Home Medications Prior to Admission medications   Medication Sig Start Date End Date Taking? Authorizing Provider  diclofenac (VOLTAREN) 75 MG EC tablet Take 1 tablet (75 mg total) by mouth 2 (two) times daily. 04/02/16   Katy Apo, NP  norgestimate-ethinyl estradiol (ORTHO-CYCLEN,SPRINTEC,PREVIFEM) 0.25-35 MG-MCG tablet Take 1 tablet by mouth daily.    [provider]  traMADol (ULTRAM) 50 MG tablet Take 1 tablet (50 mg total) by mouth at bedtime as needed for moderate pain. 04/02/16   Katy Apo, NP  promethazine (PHENERGAN) 25 MG tablet Take 25 mg by mouth every 6 (six) hours as needed. For nausea and vomiting  04/02/16  [provider]      Allergies    Amoxicillin, Cherry, and Penicillins    Review of Systems   Review of Systems  Neurological:  Positive for headaches.    Physical Exam Updated Vital Signs BP (!) 144/98   Pulse 99   Temp 98.6 F (37 C) (Oral)   Resp 20   Ht 5\' 3"  (1.6 m)   Wt 104.3 kg   LMP 09/29/2022 (Approximate)   SpO2 100%   BMI 40.74 kg/m  Physical Exam  ED Results / Procedures / Treatments   Labs (all labs ordered are listed, but only abnormal results are displayed) Labs Reviewed - No data to display  EKG None  Radiology No results found.  Procedures Procedures  {Document cardiac monitor, telemetry assessment procedure when appropriate:1}  Medications Ordered in ED Medications - No data to display  ED Course/ Medical Decision Making/ A&P   {   Click here for ABCD2, HEART and other calculatorsREFRESH Note before signing :1}                          Medical  Decision Making  ***  {Document critical care time when appropriate:1} {Document review of labs and clinical decision tools ie heart score, Chads2Vasc2 etc:1}  {Document your independent review of radiology images, and any outside records:1} {Document your discussion with family members, caretakers, and with consultants:1} {Document social determinants of health affecting pt's care:1} {Document your decision making why or why not admission, treatments were needed:1} Final Clinical Impression(s) / ED Diagnoses Final diagnoses:  None    Rx / DC Orders ED Discharge Orders     None

## 2023-04-22 ENCOUNTER — Emergency Department (HOSPITAL_COMMUNITY)
Admission: EM | Admit: 2023-04-22 | Discharge: 2023-04-22 | Disposition: A | Discharge status: Home / Self Care | Payer: Commercial Managed Care - PPO | Attending: Emergency Medicine | Admitting: Emergency Medicine

## 2023-04-22 ENCOUNTER — Encounter (HOSPITAL_COMMUNITY): Payer: Self-pay | Admitting: Emergency Medicine

## 2023-04-22 ENCOUNTER — Other Ambulatory Visit: Payer: Self-pay

## 2023-04-22 DIAGNOSIS — R070 Pain in throat: Secondary | ICD-10-CM

## 2023-04-22 DIAGNOSIS — T7840XA Allergy, unspecified, initial encounter: Secondary | ICD-10-CM | POA: Diagnosis not present

## 2023-04-22 DIAGNOSIS — Z3A01 Less than 8 weeks gestation of pregnancy: Secondary | ICD-10-CM | POA: Insufficient documentation

## 2023-04-22 DIAGNOSIS — O26891 Other specified pregnancy related conditions, first trimester: Secondary | ICD-10-CM | POA: Insufficient documentation

## 2023-04-22 MED ORDER — LACTATED RINGERS IV BOLUS
500.0000 mL | Freq: Once | INTRAVENOUS | Status: AC
  Administered 2023-04-22: 500 mL via INTRAVENOUS

## 2023-04-22 MED ORDER — DIPHENHYDRAMINE HCL 50 MG/ML IJ SOLN
50.0000 mg | Freq: Once | INTRAMUSCULAR | Status: AC
  Administered 2023-04-22: 50 mg via INTRAVENOUS
  Filled 2023-04-22: qty 1

## 2023-04-22 MED ORDER — PREDNISONE 20 MG PO TABS: ORAL_TABLET | ORAL | 0 refills | Status: DC

## 2023-04-22 MED ORDER — METHYLPREDNISOLONE SODIUM SUCC 125 MG IJ SOLR
125.0000 mg | Freq: Once | INTRAMUSCULAR | Status: AC
  Administered 2023-04-22: 125 mg via INTRAVENOUS
  Filled 2023-04-22: qty 2

## 2023-04-22 MED ORDER — PREDNISONE 10 MG PO TABS: 20.0000 mg | ORAL_TABLET | Freq: Every day | ORAL | 0 refills | Status: DC

## 2023-04-22 NOTE — ED Provider Notes (Signed)
[place] EMERGENCY DEPARTMENT AT [facility] Provider Note   CSN: [id number] Arrival date & time: 04/22/23  9629     History  Chief Complaint  Patient presents with   Allergic Reaction    Anita Mann is a 32 y.o. female.  HPI   32 year old female G2, P1 [redacted] weeks gestation presents today complaining of throat tightness and rash.  She states that several days ago she began having symptoms of rash.  Was thought to be allergic.  They stopped her vitamins which she had been on for 2 weeks.  She was started on prednisone and had Benadryl that day.  She has continued the prednisone 40 mg a day.  The rash has improved some.  She feels like her throat is still swelling in the back.  She is not having difficulty breathing, speaking, swallowing.  She has not had any lightheadedness, nausea, vomiting, diarrhea.  The rash continues to be improved.  Home Medications Prior to Admission medications   Medication Sig Start Date End Date Taking? Authorizing Provider  predniSONE (DELTASONE) 10 MG tablet Take 2 tablets (20 mg total) by mouth daily. 04/22/23  Yes Margarita Grizzle, MD  diclofenac (VOLTAREN) 75 MG EC tablet Take 1 tablet (75 mg total) by mouth 2 (two) times daily. 04/02/16   Sudie Grumbling, NP  norgestimate-ethinyl estradiol (ORTHO-CYCLEN,SPRINTEC,PREVIFEM) 0.25-35 MG-MCG tablet Take 1 tablet by mouth daily.    [provider]  predniSONE (DELTASONE) 20 MG tablet Continue for additional 2 days 04/22/23   Margarita Grizzle, MD  Prenatal Vit-Fe Fumarate-FA (PNV FOLIC ACID + IRON PO) Take 1 tablet by mouth daily.    [provider]  traMADol (ULTRAM) 50 MG tablet Take 1 tablet (50 mg total) by mouth at bedtime as needed for moderate pain. 04/02/16   Sudie Grumbling, NP  promethazine (PHENERGAN) 25 MG tablet Take 25 mg by mouth every 6 (six) hours as needed. For nausea and vomiting  04/02/16  [provider]      Allergies    Amoxicillin, Cherry, and Penicillins     Review of Systems   Review of Systems  Physical Exam Updated Vital Signs BP 114/70 (BP Location: Left Arm)   Pulse 80   Temp (!) 97.4 F (36.3 C) (Oral)   Resp 18   Ht 1.6 m (5\' 3" )   Wt 97.5 kg   LMP 02/25/2023   SpO2 100%   BMI 38.09 kg/m  Physical Exam Vitals reviewed.  Constitutional:      Appearance: Normal appearance.  HENT:     Head: Normocephalic.     Right Ear: External ear normal.     Left Ear: External ear normal.     Nose: Nose normal.     Mouth/Throat:     Pharynx: Oropharynx is clear. No oropharyngeal exudate or posterior oropharyngeal erythema.  Eyes:     Pupils: Pupils are equal, round, and reactive to light.  Cardiovascular:     Rate and Rhythm: Normal rate.  Pulmonary:     Effort: Pulmonary effort is normal.     Comments: No stridor or wheezing noted Abdominal:     General: Abdomen is flat.  Musculoskeletal:        General: Normal range of motion.     Cervical back: Normal range of motion.  Skin:    General: Skin is warm and dry.     Capillary Refill: Capillary refill takes less than 2 seconds.  Neurological:     General:  No focal deficit present.     Mental Status: She is alert.  Psychiatric:        Mood and Affect: Mood normal.     ED Results / Procedures / Treatments   Labs (all labs ordered are listed, but only abnormal results are displayed) Labs Reviewed - No data to display  EKG None  Radiology No results found.  Procedures Procedures    Medications Ordered in ED Medications  lactated ringers bolus 500 mL (500 mLs Intravenous New Bag/Given 04/22/23 1035)  methylPREDNISolone sodium succinate (SOLU-MEDROL) 125 mg/2 mL injection 125 mg (125 mg Intravenous Given 04/22/23 1030)  diphenhydrAMINE (BENADRYL) injection 50 mg (50 mg Intravenous Given 04/22/23 1026)    ED Course/ Medical Decision Making/ A&P Clinical Course as of 04/22/23 1203  Thu Apr 22, 2023  1040 Repeat vital signs reviewed and normal heart rate with blood  pressure 116/85 normal oxygen saturations [DR]    Clinical Course User Index [DR] Margarita Grizzle, MD                                 Medical Decision Making Risk Prescription drug management.   With concern for allergic reaction and feels like her throat is swelling close. Patient does not have symptoms of difficulty swallowing or breathing.  She was given Solu-Medrol here in the ED.  She is observed for 2 hours.  She feels slightly improved.  We have discussed options moving forward of imaging of her neck, endoscopy here in the ED, or outpatient observation. Have a low index of suspicion for infection, mass, or other etiologies of her throat tightening symptoms.  She is oxygenating well.  She is not having any difficulty swallowing or speaking.  Therefore, I do not think that imaging with CT scan or other imaging modes are indicated at this time. Patient lives in [place] and has easy access to hospital.  We have discussed return precautions and need for close follow-up. Will continue Benadryl and continue prednisone for an additional 2 days.  Patient voices understanding of this plan.        Final Clinical Impression(s) / ED Diagnoses Final diagnoses:  Allergic reaction, initial encounter  Throat discomfort    Rx / DC Orders ED Discharge Orders          Ordered    predniSONE (DELTASONE) 10 MG tablet  Daily        04/22/23 1202    predniSONE (DELTASONE) 20 MG tablet        04/22/23 1202              Margarita Grizzle, MD 04/22/23 1203

## 2023-04-22 NOTE — Discharge Instructions (Addendum)
Continue Benadryl 25 mg every 6 hours till symptoms resolved Return immediately to the emergency department if you are having any difficulty breathing, speaking, swallowing or other new symptoms.

## 2023-04-22 NOTE — ED Triage Notes (Addendum)
Pt. Stated, I feel like my throat is closing. I stopped taking the prenatal vitamins which is the only thing new. This started on Monday. I went to a walk in clinic and they gave me some Prednisone 2 days ago and have not helped. This morning the feeling of my throat swelling back. Im [redacted] weeks pregnant.

## 2023-04-27 HISTORY — PX: DILATION AND CURETTAGE OF UTERUS: SHX78

## 2023-05-15 ENCOUNTER — Ambulatory Visit (HOSPITAL_COMMUNITY)
Admission: EM | Admit: 2023-05-15 | Discharge: 2023-05-15 | Disposition: A | Discharge status: Home / Self Care | Payer: Commercial Managed Care - PPO | Attending: Urgent Care | Admitting: Urgent Care

## 2023-05-15 ENCOUNTER — Encounter (HOSPITAL_COMMUNITY): Payer: Self-pay

## 2023-05-15 ENCOUNTER — Telehealth (HOSPITAL_COMMUNITY): Payer: Self-pay | Admitting: Emergency Medicine

## 2023-05-15 DIAGNOSIS — Z8759 Personal history of other complications of pregnancy, childbirth and the puerperium: Secondary | ICD-10-CM

## 2023-05-15 DIAGNOSIS — R109 Unspecified abdominal pain: Secondary | ICD-10-CM

## 2023-05-15 LAB — CBC WITH DIFFERENTIAL/PLATELET
Abs Immature Granulocytes: 0.01 10*3/uL (ref 0.00–0.07)
Basophils Absolute: 0 10*3/uL (ref 0.0–0.1)
Basophils Relative: 0 %
Eosinophils Absolute: 0.1 10*3/uL (ref 0.0–0.5)
Eosinophils Relative: 3 %
HCT: 33.2 % — ABNORMAL LOW (ref 36.0–46.0)
Hemoglobin: 10.1 g/dL — ABNORMAL LOW (ref 12.0–15.0)
Immature Granulocytes: 0 %
Lymphocytes Relative: 38 %
Lymphs Abs: 1.8 10*3/uL (ref 0.7–4.0)
MCH: 24.6 pg — ABNORMAL LOW (ref 26.0–34.0)
MCHC: 30.4 g/dL (ref 30.0–36.0)
MCV: 81 fL (ref 80.0–100.0)
Monocytes Absolute: 0.3 10*3/uL (ref 0.1–1.0)
Monocytes Relative: 6 %
Neutro Abs: 2.4 10*3/uL (ref 1.7–7.7)
Neutrophils Relative %: 53 %
Platelets: 309 10*3/uL (ref 150–400)
RBC: 4.1 MIL/uL (ref 3.87–5.11)
RDW: 16 % — ABNORMAL HIGH (ref 11.5–15.5)
WBC: 4.6 10*3/uL (ref 4.0–10.5)
nRBC: 0 % (ref 0.0–0.2)

## 2023-05-15 LAB — BASIC METABOLIC PANEL
Anion gap: 10 (ref 5–15)
BUN: 8 mg/dL (ref 6–20)
CO2: 23 mmol/L (ref 22–32)
Calcium: 8.7 mg/dL — ABNORMAL LOW (ref 8.9–10.3)
Chloride: 103 mmol/L (ref 98–111)
Creatinine, Ser: 0.64 mg/dL (ref 0.44–1.00)
GFR, Estimated: >60 mL/min (ref >60)
Glucose, Bld: 95 mg/dL (ref 70–99)
Potassium: 3.9 mmol/L (ref 3.5–5.1)
Sodium: 136 mmol/L (ref 135–145)

## 2023-05-15 LAB — POCT URINALYSIS DIP (MANUAL ENTRY)
Bilirubin, UA: NEGATIVE
Blood, UA: NEGATIVE
Glucose, UA: NEGATIVE mg/dL
Ketones, POC UA: NEGATIVE mg/dL
Nitrite, UA: NEGATIVE
Protein Ur, POC: NEGATIVE mg/dL
Spec Grav, UA: 1.025 (ref 1.010–1.025)
Urobilinogen, UA: 0.2 E.U./dL
pH, UA: 5.5 (ref 5.0–8.0)

## 2023-05-15 LAB — POCT URINE PREGNANCY: Preg Test, Ur: POSITIVE — AB

## 2023-05-15 LAB — HCG, QUANTITATIVE, PREGNANCY: hCG, Beta Chain, Quant, S: 262 m[IU]/mL — ABNORMAL HIGH (ref <5)

## 2023-05-15 MED ORDER — TRAMADOL HCL 50 MG PO TABS
50.0000 mg | ORAL_TABLET | Freq: Four times a day (QID) | ORAL | 0 refills | Status: AC | PRN
Start: 2023-05-15 — End: ?

## 2023-05-15 MED ORDER — CARISOPRODOL 350 MG PO TABS: 350.0000 mg | ORAL_TABLET | Freq: Three times a day (TID) | ORAL | 0 refills | Status: DC

## 2023-05-15 MED ORDER — METAXALONE 800 MG PO TABS: 800.0000 mg | ORAL_TABLET | Freq: Three times a day (TID) | ORAL | 0 refills | Status: AC

## 2023-05-15 NOTE — Telephone Encounter (Signed)
Pt requesting alternative to carisoprodol due to availability issues. Alternative (skelaxin) called in instead.

## 2023-05-15 NOTE — ED Provider Notes (Signed)
[facility]    CSN: [id number] Arrival date & time: 05/15/23  1002      History   Chief Complaint Chief Complaint  Patient presents with   Back Pain    HPI Anita Mann is a 32 y.o. female.   32yo female presents today with complaints of back pain. States it started 4-5 days ago, located primarily to the L flank. On 04/27/23 had a D&C due to an incomplete miscarriage. No complications from the procedure per pt. Was discharged home on prednisone and iron. States she had intermittent bleeding/ spotting for about 2 weeks, which stopped 4 days ago, around the same time that the L flank pain started. Sitting up makes it worse, laying on her back on top of a heating pad helps mildly and temporarily. She denies any muscle spasms. Did try 400mg  ibuprofen which did not help. She denies urinary frequency, hematuria, cloudy urine or dysuria. She states her last normal BM was yesterday morning, denies constipation or diarrhea. Pt denies hx of kidney stones. No fevers or rashes. Has OB follow up in 4 days.   Back Pain   History reviewed. No pertinent past medical history.  Patient Active Problem List   Diagnosis Date Noted   Pain in the abdomen 01/25/2013    Past Surgical History:  Procedure Laterality Date   ANKLE SURGERY     CESAREAN SECTION     DILATION AND CURETTAGE OF UTERUS  04/27/2023   Pt states she had a miscarriage    OB History     Gravida  1   Para  1   Term  1   Preterm  0   AB  0   Living  1      SAB  0   IAB  0   Ectopic  0   Multiple  0   Live Births               Home Medications    Prior to Admission medications   Medication Sig Start Date End Date Taking? Authorizing Provider  carisoprodol (SOMA) 350 MG tablet Take 1 tablet (350 mg total) by mouth 3 (three) times daily. 05/15/23  Yes Malyk Girouard L, PA  ferrous sulfate 325 (65 FE) MG tablet Take by mouth. 04/28/23 07/27/23 Yes [provider]  traMADol (ULTRAM)  50 MG tablet Take 1 tablet (50 mg total) by mouth every 6 (six) hours as needed for severe pain. 05/15/23  Yes Kiernan Atkerson L, PA  promethazine (PHENERGAN) 25 MG tablet Take 25 mg by mouth every 6 (six) hours as needed. For nausea and vomiting  04/02/16  [provider]    Family History History reviewed. No pertinent family history.  Social History Social History   Tobacco Use   Smoking status: Former    Types: Cigarettes  Vaping Use   Vaping status: Never Used  Substance Use Topics   Alcohol use: Not Currently    Comment: ocassionally   Drug use: No     Allergies   Amoxicillin, Cherry, and Penicillins   Review of Systems Review of Systems  Musculoskeletal:  Positive for back pain.  As per HPI   Physical Exam Triage Vital Signs ED Triage Vitals  Encounter Vitals Group     BP 05/15/23 1022 134/84     Systolic BP Percentile --      Diastolic BP Percentile --      Pulse Rate 05/15/23 1022 88     Resp --  Temp 05/15/23 1022 98.1 F (36.7 C)     Temp Source 05/15/23 1022 Oral     SpO2 05/15/23 1022 98 %     Weight 05/15/23 1020 217 lb (98.4 kg)     Height 05/15/23 1020 5\' 3"  (1.6 m)     Head Circumference --      Peak Flow --      Pain Score 05/15/23 1019 7     Pain Loc --      Pain Education --      Exclude from Growth Chart --    No data found.  Updated Vital Signs BP 134/84 (BP Location: Left Arm)   Pulse 88   Temp 98.1 F (36.7 C) (Oral)   Ht 5\' 3"  (1.6 m)   Wt 217 lb (98.4 kg)   LMP 02/25/2023   SpO2 98%   BMI 38.44 kg/m   Visual Acuity Right Eye Distance:   Left Eye Distance:   Bilateral Distance:    Right Eye Near:   Left Eye Near:    Bilateral Near:     Physical Exam Vitals and nursing note reviewed.  Constitutional:      Appearance: Normal appearance. She is not ill-appearing, toxic-appearing or diaphoretic.     Comments: Pt sitting upright on chair, leaning slightly forward due to L flank pain  HENT:     Head:  Normocephalic.  Cardiovascular:     Rate and Rhythm: Normal rate.  Pulmonary:     Effort: Pulmonary effort is normal. No respiratory distress.  Abdominal:     General: There is no distension.     Palpations: Abdomen is soft.     Tenderness: There is no right CVA tenderness, left CVA tenderness, guarding or rebound.  Musculoskeletal:        General: Tenderness (able to reproduce the tenderness to L lower back with palpation) present. No swelling, deformity or signs of injury. Normal range of motion.     Cervical back: Normal range of motion.       Back:  Skin:    General: Skin is warm and dry.     Coloration: Skin is not jaundiced.     Findings: No bruising, erythema or rash.  Neurological:     General: No focal deficit present.     Mental Status: She is alert and oriented to person, place, and time.     Sensory: No sensory deficit.     Motor: No weakness.      UC Treatments / Results  Labs (all labs ordered are listed, but only abnormal results are displayed) Labs Reviewed  POCT URINE PREGNANCY - Abnormal; Notable for the following components:      Result Value   Preg Test, Ur Positive (*)    All other components within normal limits  POCT URINALYSIS DIP (MANUAL ENTRY) - Abnormal; Notable for the following components:   Leukocytes, UA Trace (*)    All other components within normal limits  CBC WITH DIFFERENTIAL/PLATELET  BASIC METABOLIC PANEL  HCG, QUANTITATIVE, PREGNANCY    EKG   Radiology No results found.  Procedures Procedures (including critical care time)  Medications Ordered in UC Medications - No data to display  Initial Impression / Assessment and Plan / UC Course  I have reviewed the triage vital signs and the nursing notes.  Pertinent labs & imaging results that were available during my care of the patient were reviewed by me and considered in my medical decision making (see chart for  details).     Left flank pain - pt with reproducible  tenderness to deep palpation of the muscles to left lateral lower back, distal to the renal area. She exhibits no signs of UTI or pyelonephritis, UA negative for infection. VSS. Pt was told not to take any medications when discharged from hospital, so has not been taking anything for discomfort. Physical exam favors a muscular cause, however will draw additional labs to further assess. Pt to take tylenol for mild/ mod pain, tramadol only for severe pain. Continue heating pad and add soma.  History of miscarriage - roughly two weeks ago. Pregnancy test still positive, however this may be normal. Will obtain quantitative HCG level, was 94,000 on 04/27/23. Pt instructed to head to ER if any new or worsening symptoms occurs, or if no response to above tx.   Final Clinical Impressions(s) / UC Diagnoses   Final diagnoses:  Acute left flank pain  History of miscarriage     Discharge Instructions      Your urine does not show evidence of infection. Your urine pregnancy is still positive, however is likely due to recent miscarriage. We drew additional labs to further assess the cause of your symptoms. You may take 1000mg  of Tylenol (acetaminophen) every 8 hours as needed for pain. For more severe pain, you may take Tramadol. Only take for severe pain as it can cause drowsiness. I have also called in a muscle relaxer, soma. Take this up to every 8 hours as needed. This medication can also make you feel sleepy, so do not operate machinery or drive a car after taking. Please continue use of a heating pad to the affected area of your back.  If your symptoms persist despite above treatment, worsen, or new symptoms develop, please head to the ER.      ED Prescriptions     Medication Sig Dispense Auth. Provider   traMADol (ULTRAM) 50 MG tablet Take 1 tablet (50 mg total) by mouth every 6 (six) hours as needed for severe pain. 8 tablet Egbert Seidel L, PA   carisoprodol (SOMA) 350 MG tablet Take 1  tablet (350 mg total) by mouth 3 (three) times daily. 15 tablet Zaire Levesque L, [place]      I have reviewed the PDMP during this encounter.   Maretta Bees, [place] 05/15/23 1222

## 2023-05-15 NOTE — Telephone Encounter (Signed)
Pt called stating that pharmacy does not have carisoprodol (SOMA) 350 MG tablet in stock. She is requesting an alternative Rx. Please advise.

## 2023-05-15 NOTE — ED Triage Notes (Signed)
PT c/o back pain x4days  Pt states that the pain is getting progressively worse.  Pt states that she got out of bed with pain  Pt denies any heavy lifting, new exercises, or activities  Pt states that she had a [facility] 04/27/23 due to a miscarriage.   Pt states the pain is throbbing in low lateral left of her back close to the ribs.

## 2023-05-15 NOTE — Discharge Instructions (Addendum)
Your urine does not show evidence of infection. Your urine pregnancy is still positive, however is likely due to recent miscarriage. We drew additional labs to further assess the cause of your symptoms. You may take 1000mg  of Tylenol (acetaminophen) every 8 hours as needed for pain. For more severe pain, you may take Tramadol. Only take for severe pain as it can cause drowsiness. I have also called in a muscle relaxer, soma. Take this up to every 8 hours as needed. This medication can also make you feel sleepy, so do not operate machinery or drive a car after taking. Please continue use of a heating pad to the affected area of your back.  If your symptoms persist despite above treatment, worsen, or new symptoms develop, please head to the ER.
